# Patient Record
Sex: Male | Born: 1995 | Race: Black or African American | Hispanic: No | Marital: Single | Smoking: Never smoker
Health system: Southern US, Community
[De-identification: ages and names within clinical notes are randomized; demographics above are authoritative.]

## PROBLEM LIST (undated history)

## (undated) DIAGNOSIS — J45909 Unspecified asthma, uncomplicated: Secondary | ICD-10-CM

## (undated) DIAGNOSIS — F988 Other specified behavioral and emotional disorders with onset usually occurring in childhood and adolescence: Secondary | ICD-10-CM

## (undated) DIAGNOSIS — J302 Other seasonal allergic rhinitis: Secondary | ICD-10-CM

## (undated) HISTORY — PX: OTHER SURGICAL HISTORY: SHX169

## (undated) HISTORY — DX: Unspecified asthma, uncomplicated: J45.909

## (undated) HISTORY — DX: Other specified behavioral and emotional disorders with onset usually occurring in childhood and adolescence: F98.8

## (undated) HISTORY — DX: Other seasonal allergic rhinitis: J30.2

---
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
## 2016-11-14 ENCOUNTER — Ambulatory Visit (INDEPENDENT_AMBULATORY_CARE_PROVIDER_SITE_OTHER): Payer: Managed Care, Other (non HMO) | Admitting: Family Medicine

## 2016-11-14 ENCOUNTER — Encounter: Payer: Self-pay | Admitting: Family Medicine

## 2016-11-14 ENCOUNTER — Other Ambulatory Visit (HOSPITAL_COMMUNITY)
Admission: RE | Admit: 2016-11-14 | Discharge: 2016-11-14 | Disposition: A | Discharge status: Home / Self Care | Payer: Managed Care, Other (non HMO) | Source: Ambulatory Visit | Attending: Family Medicine | Admitting: Family Medicine

## 2016-11-14 VITALS — BP 122/88 | HR 62 | Temp 98.1°F | Ht 72.0 in | Wt 152.4 lb

## 2016-11-14 DIAGNOSIS — Z7251 High risk heterosexual behavior: Secondary | ICD-10-CM

## 2016-11-14 DIAGNOSIS — M545 Low back pain, unspecified: Secondary | ICD-10-CM

## 2016-11-14 DIAGNOSIS — A749 Chlamydial infection, unspecified: Secondary | ICD-10-CM | POA: Diagnosis not present

## 2016-11-14 DIAGNOSIS — J4520 Mild intermittent asthma, uncomplicated: Secondary | ICD-10-CM | POA: Diagnosis not present

## 2016-11-14 DIAGNOSIS — J302 Other seasonal allergic rhinitis: Secondary | ICD-10-CM | POA: Insufficient documentation

## 2016-11-14 DIAGNOSIS — N489 Disorder of penis, unspecified: Secondary | ICD-10-CM | POA: Diagnosis not present

## 2016-11-14 DIAGNOSIS — B9689 Other specified bacterial agents as the cause of diseases classified elsewhere: Secondary | ICD-10-CM | POA: Diagnosis not present

## 2016-11-14 DIAGNOSIS — R030 Elevated blood-pressure reading, without diagnosis of hypertension: Secondary | ICD-10-CM

## 2016-11-14 DIAGNOSIS — Z202 Contact with and (suspected) exposure to infections with a predominantly sexual mode of transmission: Secondary | ICD-10-CM | POA: Insufficient documentation

## 2016-11-14 DIAGNOSIS — R202 Paresthesia of skin: Secondary | ICD-10-CM | POA: Diagnosis not present

## 2016-11-14 DIAGNOSIS — J45909 Unspecified asthma, uncomplicated: Secondary | ICD-10-CM | POA: Insufficient documentation

## 2016-11-14 LAB — CBC
HEMATOCRIT: 44.4 % (ref 39.0–52.0)
HEMOGLOBIN: 14.8 g/dL (ref 13.0–17.0)
MCHC: 33.3 g/dL (ref 30.0–36.0)
MCV: 87.9 fl (ref 78.0–100.0)
PLATELETS: 223 10*3/uL (ref 150.0–400.0)
RBC: 5.05 Mil/uL (ref 4.22–5.81)
RDW: 12.7 % (ref 11.5–15.5)
WBC: 4 10*3/uL (ref 4.0–10.5)

## 2016-11-14 LAB — TSH: TSH: 0.64 u[IU]/mL (ref 0.35–4.50)

## 2016-11-14 LAB — COMPREHENSIVE METABOLIC PANEL
ALBUMIN: 5.1 g/dL (ref 3.5–5.2)
ALK PHOS: 69 U/L (ref 39–117)
ALT: 29 U/L (ref 0–53)
AST: 25 U/L (ref 0–37)
BILIRUBIN TOTAL: 2.1 mg/dL — AB (ref 0.2–1.2)
BUN: 10 mg/dL (ref 6–23)
CALCIUM: 10.2 mg/dL (ref 8.4–10.5)
CO2: 28 mEq/L (ref 19–32)
CREATININE: 1 mg/dL (ref 0.40–1.50)
Chloride: 101 mEq/L (ref 96–112)
GFR: 120.89 mL/min (ref >60.00)
Glucose, Bld: 76 mg/dL (ref 70–99)
Potassium: 3.9 mEq/L (ref 3.5–5.1)
Sodium: 138 mEq/L (ref 135–145)
TOTAL PROTEIN: 8.2 g/dL (ref 6.0–8.3)

## 2016-11-14 LAB — POC URINALSYSI DIPSTICK (AUTOMATED)
BILIRUBIN UA: NEGATIVE
Clarity, UA: NEGATIVE
Glucose, UA: NEGATIVE
Leukocytes, UA: NEGATIVE
PH UA: 6.5 (ref 5.0–8.0)
Spec Grav, UA: 1.02 (ref 1.010–1.025)
Urobilinogen, UA: 0.2 E.U./dL

## 2016-11-14 LAB — VITAMIN B12: VITAMIN B 12: 422 pg/mL (ref 211–911)

## 2016-11-14 MED ORDER — AZITHROMYCIN 500 MG PO TABS: ORAL_TABLET | ORAL | 0 refills | Status: DC

## 2016-11-14 NOTE — Assessment & Plan Note (Signed)
S: usually bothered by allergy season- but has been taking claritin which helps. Albuterol prn- rare use at present A/P: offered refill of albuterol which he declines for now

## 2016-11-14 NOTE — Assessment & Plan Note (Signed)
S: initial # elevated- better on repeat.  BP Readings from Last 3 Encounters:  11/14/16 130/90--> 122/88  11/15/11 132/72  A/P: We discussed blood pressure goal of <140/90 prefer <130/80. Discussed low salt diet

## 2016-11-14 NOTE — Patient Instructions (Addendum)
Blood pressure riding high normal. DASh eating plan/low salt diet can help  Please stop by lab before you go  Testing for hiv, syphilis, gonorrhea, chlamydia, trichomonas.  Visual inspection for warts and herpes Did not test herpes by blood. Should be immunized for Hep B so did not test that  No symptoms correct?   DASH Eating Plan DASH stands for "Dietary Approaches to Stop Hypertension." The DASH eating plan is a healthy eating plan that has been shown to reduce high blood pressure (hypertension). It may also reduce your risk for type 2 diabetes, heart disease, and stroke. The DASH eating plan may also help with weight loss. What are tips for following this plan? General guidelines  Avoid eating more than 2,300 mg (milligrams) of salt (sodium) a day. If you have hypertension, you may need to reduce your sodium intake to 1,500 mg a day.  Limit alcohol intake to no more than 1 drink a day for nonpregnant women and 2 drinks a day for men. One drink equals 12 oz of beer, 5 oz of wine, or 1 oz of hard liquor.  Work with your health care provider to maintain a healthy body weight or to lose weight. Ask what an ideal weight is for you.  Get at least 30 minutes of exercise that causes your heart to beat faster (aerobic exercise) most days of the week. Activities may include walking, swimming, or biking.  Work with your health care provider or diet and nutrition specialist (dietitian) to adjust your eating plan to your individual calorie needs. Reading food labels  Check food labels for the amount of sodium per serving. Choose foods with less than 5 percent of the Daily Value of sodium. Generally, foods with less than 300 mg of sodium per serving fit into this eating plan.  To find whole grains, look for the word "whole" as the first word in the ingredient list. Shopping  Buy products labeled as "low-sodium" or "no salt added."  Buy fresh foods. Avoid canned foods and premade or frozen  meals. Cooking  Avoid adding salt when cooking. Use salt-free seasonings or herbs instead of table salt or sea salt. Check with your health care provider or pharmacist before using salt substitutes.  Do not fry foods. Cook foods using healthy methods such as baking, boiling, grilling, and broiling instead.  Cook with heart-healthy oils, such as olive, canola, soybean, or sunflower oil. Meal planning   Eat a balanced diet that includes: ? 5 or more servings of fruits and vegetables each day. At each meal, try to fill half of your plate with fruits and vegetables. ? Up to 6-8 servings of whole grains each day. ? Less than 6 oz of lean meat, poultry, or fish each day. A 3-oz serving of meat is about the same size as a deck of cards. One egg equals 1 oz. ? 2 servings of low-fat dairy each day. ? A serving of nuts, seeds, or beans 5 times each week. ? Heart-healthy fats. Healthy fats called Omega-3 fatty acids are found in foods such as flaxseeds and coldwater fish, like sardines, salmon, and mackerel.  Limit how much you eat of the following: ? Canned or prepackaged foods. ? Food that is high in trans fat, such as fried foods. ? Food that is high in saturated fat, such as fatty meat. ? Sweets, desserts, sugary drinks, and other foods with added sugar. ? Full-fat dairy products.  Do not salt foods before eating.  Try to eat  at least 2 vegetarian meals each week.  Eat more home-cooked food and less restaurant, buffet, and fast food.  When eating at a restaurant, ask that your food be prepared with less salt or no salt, if possible. What foods are recommended? The items listed may not be a complete list. Talk with your dietitian about what dietary choices are best for you. Grains Whole-grain or whole-wheat bread. Whole-grain or whole-wheat pasta. Brown rice. [place]. Bulgur. Whole-grain and low-sodium cereals. Pita bread. Low-fat, low-sodium crackers. Whole-wheat flour  tortillas. Vegetables Fresh or frozen vegetables (raw, steamed, roasted, or grilled). Low-sodium or reduced-sodium tomato and vegetable juice. Low-sodium or reduced-sodium tomato sauce and tomato paste. Low-sodium or reduced-sodium canned vegetables. Fruits All fresh, dried, or frozen fruit. Canned fruit in natural juice (without added sugar). Meat and other protein foods Skinless chicken or [place]. Ground chicken or [place]. Pork with fat trimmed off. Fish and seafood. Egg whites. Dried beans, peas, or lentils. Unsalted nuts, nut butters, and seeds. Unsalted canned beans. Lean cuts of beef with fat trimmed off. Low-sodium, lean deli meat. Dairy Low-fat (1%) or fat-free (skim) milk. Fat-free, low-fat, or reduced-fat cheeses. Nonfat, low-sodium ricotta or cottage cheese. Low-fat or nonfat yogurt. Low-fat, low-sodium cheese. Fats and oils Soft margarine without trans fats. Vegetable oil. Low-fat, reduced-fat, or light mayonnaise and salad dressings (reduced-sodium). Canola, safflower, olive, soybean, and sunflower oils. Avocado. Seasoning and other foods Herbs. Spices. Seasoning mixes without salt. Unsalted popcorn and pretzels. Fat-free sweets. What foods are not recommended? The items listed may not be a complete list. Talk with your dietitian about what dietary choices are best for you. Grains Baked goods made with fat, such as croissants, muffins, or some breads. Dry pasta or rice meal packs. Vegetables Creamed or fried vegetables. Vegetables in a cheese sauce. Regular canned vegetables (not low-sodium or reduced-sodium). Regular canned tomato sauce and paste (not low-sodium or reduced-sodium). Regular tomato and vegetable juice (not low-sodium or reduced-sodium). Angie Fava. Olives. Fruits Canned fruit in a light or heavy syrup. Fried fruit. Fruit in cream or butter sauce. Meat and other protein foods Fatty cuts of meat. Ribs. Fried meat. Berniece Salines. Sausage. Bologna and other processed lunch meats.  Salami. Fatback. Hotdogs. Bratwurst. Salted nuts and seeds. Canned beans with added salt. Canned or smoked fish. Whole eggs or egg yolks. Chicken or [place] with skin. Dairy Whole or 2% milk, cream, and half-and-half. Whole or full-fat cream cheese. Whole-fat or sweetened yogurt. Full-fat cheese. Nondairy creamers. Whipped toppings. Processed cheese and cheese spreads. Fats and oils Butter. Stick margarine. Lard. Shortening. Ghee. Bacon fat. Tropical oils, such as coconut, palm kernel, or palm oil. Seasoning and other foods Salted popcorn and pretzels. Onion salt, garlic salt, seasoned salt, table salt, and sea salt. Worcestershire sauce. Tartar sauce. Barbecue sauce. Teriyaki sauce. Soy sauce, including reduced-sodium. Steak sauce. Canned and packaged gravies. Fish sauce. Oyster sauce. Cocktail sauce. Horseradish that you find on the shelf. Ketchup. Mustard. Meat flavorings and tenderizers. Bouillon cubes. Hot sauce and Tabasco sauce. Premade or packaged marinades. Premade or packaged taco seasonings. Relishes. Regular salad dressings. Where to find more information:  National Heart, Lung, and [place]: https://[place]-eaton.com/  American Heart Association: www.heart.org Summary  The DASH eating plan is a healthy eating plan that has been shown to reduce high blood pressure (hypertension). It may also reduce your risk for type 2 diabetes, heart disease, and stroke.  With the DASH eating plan, you should limit salt (sodium) intake to 2,300 mg a day. If you have hypertension,  you may need to reduce your sodium intake to 1,500 mg a day.  When on the DASH eating plan, aim to eat more fresh fruits and vegetables, whole grains, lean proteins, low-fat dairy, and heart-healthy fats.  Work with your health care provider or diet and nutrition specialist (dietitian) to adjust your eating plan to your individual calorie needs. This information is not intended to replace advice given to you by your health  care provider. Make sure you discuss any questions you have with your health care provider. Document Released: 03/28/2011 Document Revised: 04/01/2016 Document Reviewed: 04/01/2016 Elsevier Interactive Patient Education  2017 [employer]
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
## 2016-11-15 LAB — HIV ANTIBODY (ROUTINE TESTING W REFLEX): HIV: NONREACTIVE

## 2016-11-15 LAB — RPR

## 2016-11-15 LAB — HSV(HERPES SIMPLEX VRS) I + II AB-IGG
HSV 1 GLYCOPROTEIN G AB, IGG: 40.9 {index} — AB (ref <0.90)
HSV 2 Glycoprotein G Ab, IgG: <0.9 Index (ref <0.90)

## 2016-11-18 LAB — URINE CYTOLOGY ANCILLARY ONLY
CHLAMYDIA, DNA PROBE: POSITIVE — AB
NEISSERIA GONORRHEA: NEGATIVE
TRICH (WINDOWPATH): NEGATIVE

## 2016-11-19 LAB — HSV 1/2 AB (IGM), IFA W/RFLX TITER
HSV 1 IGM SCREEN: NEGATIVE
HSV 2 IgM Screen: NEGATIVE

## 2016-11-21 ENCOUNTER — Other Ambulatory Visit: Payer: Self-pay

## 2016-11-21 DIAGNOSIS — R17 Unspecified jaundice: Secondary | ICD-10-CM

## 2016-11-26 ENCOUNTER — Encounter: Payer: Self-pay | Admitting: Family Medicine

## 2016-12-17 NOTE — Addendum Note (Signed)
Addended by: Charna Elizabeth on: 12/17/2016 12:05 PM   Modules accepted: Orders

## 2016-12-20 ENCOUNTER — Other Ambulatory Visit: Payer: Managed Care, Other (non HMO)

## 2017-02-24 ENCOUNTER — Emergency Department (HOSPITAL_COMMUNITY)
Admission: EM | Admit: 2017-02-24 | Discharge: 2017-02-24 | Disposition: A | Discharge status: Home / Self Care | Payer: Managed Care, Other (non HMO) | Attending: Emergency Medicine | Admitting: Emergency Medicine

## 2017-02-24 ENCOUNTER — Ambulatory Visit (HOSPITAL_COMMUNITY)
Admission: EM | Admit: 2017-02-24 | Discharge: 2017-02-24 | Disposition: A | Discharge status: Home / Self Care | Payer: Managed Care, Other (non HMO)

## 2017-02-24 DIAGNOSIS — F0781 Postconcussional syndrome: Secondary | ICD-10-CM | POA: Diagnosis not present

## 2017-02-24 DIAGNOSIS — R51 Headache: Secondary | ICD-10-CM | POA: Diagnosis not present

## 2017-02-24 DIAGNOSIS — J45909 Unspecified asthma, uncomplicated: Secondary | ICD-10-CM | POA: Diagnosis not present
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
## 2017-02-24 NOTE — ED Notes (Signed)
Pt sent to the ER
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
## 2017-02-24 NOTE — ED Provider Notes (Signed)
[facility]ENC[facility] Provider Note   CSN: [id number] Arrival date & time: 02/24/17  1349     History   Chief Complaint Chief Complaint  Patient presents with  . Head Injury    HPI Patrick Richmond is a 21 y.o. male.  HPI  21 year old male with headache and fatigue.  Patient reports that he was drinking a few days ago and passed out fell backwards striking his head.  Thinks he had a brief loss of consciousness.  No neck pain.  No nausea or vomiting.  This feels like his attention is "off." Broke down crying one time w/o apparent reason.  Past Medical History:  Diagnosis Date  . ADD (attention deficit disorder)    Dr. Herbie SaxonHewett-diagnosed by [facility]. Prior on concerta 54mg . 12/2012 mentioned diagnosed 5 years ago but was not on meds for sometime.   . Asthma    usually bothered by allergy season  . Seasonal allergies    claritin prn. spring and fall    Patient Active Problem List   Diagnosis Date Noted  . Elevated blood pressure reading 11/14/2016  . Asthma   . Seasonal allergies     Past Surgical History:  Procedure Laterality Date  . none         Home Medications    Prior to Admission medications   Medication Sig Start Date End Date Taking? Authorizing Provider  albuterol (PROVENTIL HFA;VENTOLIN HFA) 108 (90 Base) MCG/ACT inhaler Inhale 2 puffs every 6 (six) hours as needed into the lungs for wheezing.   Yes [provider]  ibuprofen (ADVIL,MOTRIN) 200 MG tablet Take 400 mg every 6 (six) hours as needed by mouth for mild pain.   Yes [provider]  azithromycin (ZITHROMAX) 500 MG tablet Take 2 pills all at once Patient not taking: Reported on 02/24/2017 11/14/16   Shelva MajesticHunter, Halston Fairclough O, MD    Family History Family History  Problem Relation Age of Onset  . Hypertension Father   . Healthy Mother   . Healthy Sister   . Healthy Brother   . Suicidality Maternal Grandfather        led to death  . Other Paternal  Grandfather        unknown  . Healthy Brother   . Healthy Brother     Social History Social History   Tobacco Use  . Smoking status: Never Smoker  . Smokeless tobacco: Never Used  Substance Use Topics  . Alcohol use: Yes    Alcohol/week: 0.0 - 3.6 oz  . Drug use: No     Allergies   Patient has no known allergies.   Review of Systems Review of Systems   All systems reviewed and negative, other than as noted in HPI.   All systems reviewed and negative, other than as noted in HPI. Physical Exam Updated Vital Signs BP (!) 162/119 (BP Location: Left Arm)   Pulse 71   Temp 98.4 F (36.9 C) (Oral)   Resp 18   Ht 6' (1.829 m)   Wt 70.3 kg (155 lb)   SpO2 100%   BMI 21.02 kg/m   Physical Exam  Constitutional: He is oriented to person, place, and time. He appears well-developed and well-nourished. No distress.  HENT:  Head: Normocephalic and atraumatic.  Eyes: Conjunctivae are normal. Right eye exhibits no discharge. Left eye exhibits no discharge.  Neck: Neck supple.  Cardiovascular: Normal rate, regular rhythm and normal heart sounds. Exam reveals no gallop and no  friction rub.  No murmur heard. Pulmonary/Chest: Effort normal and breath sounds normal. No respiratory distress.  Abdominal: Soft. He exhibits no distension. There is no tenderness.  Musculoskeletal: He exhibits no edema or tenderness.  Neurological: He is alert and oriented to person, place, and time. No cranial nerve deficit. He exhibits normal muscle tone. Coordination normal.  Skin: Skin is warm and dry.  Psychiatric: He has a normal mood and affect. His behavior is normal. Thought content normal.  Nursing note and vitals reviewed.    ED Treatments / Results  Labs (all labs ordered are listed, but only abnormal results are displayed) Labs Reviewed - No data to display  EKG  EKG Interpretation None       Radiology No results found.  Procedures Procedures (including critical care  time)  Medications Ordered in ED Medications - No data to display   Initial Impression / Assessment and Plan / ED Course  I have reviewed the triage vital signs and the nursing notes.  Pertinent labs & imaging results that were available during my care of the patient were reviewed by me and considered in my medical decision making (see chart for details).     21yM with likely post-concussive syndrome. Emotional lability can be a symptom of this.   Final Clinical Impressions(s) / ED Diagnoses   Final diagnoses:  Post concussion syndrome    ED Discharge Orders    None       Raeford Razor, MD 03/04/17 1208

## 2017-02-24 NOTE — ED Notes (Signed)
Pt verbalized understanding discharge instructions and denies any further needs or questions at this time. VS stable, ambulatory and steady gait.   

## 2017-03-05 ENCOUNTER — Telehealth: Payer: Self-pay | Admitting: Family Medicine

## 2017-03-05 NOTE — Telephone Encounter (Signed)
Patient returning missed phone call. Informed patient that note below stated he was not seen by Dr. Durene CalHunter for his concussion. Patient stated he was. Informed patient that today was  Dr. Erasmo LeventhalHunter's half day and I would have to leave a note. Please call patient and advise.

## 2017-03-05 NOTE — Telephone Encounter (Signed)
Tried to call patient back. His voice mail box is full. Patient was evaluated in the ED. We have not seen him for a concussion. He is supposed to schedule a follow up with Dr. Durene CalHunter per the instructions on his discharge paperwork from the hospital.

## 2017-03-05 NOTE — Telephone Encounter (Signed)
Patient says Dr. Durene CalHunter is supposed to give him a note RE concussion. He was in ED and needs one for school and work. Please advise.

## 2017-03-11 NOTE — Telephone Encounter (Signed)
Called patient. No answer received. The mailbox was full so I was unable to leave a message

## 2017-06-03 ENCOUNTER — Other Ambulatory Visit: Payer: Managed Care, Other (non HMO)

## 2017-06-03 NOTE — Addendum Note (Signed)
Addended by: Charna ElizabethLEMMONS, Khushboo Chuck L on: 06/03/2017 03:36 PM   Modules accepted: Orders

## 2017-06-04 ENCOUNTER — Other Ambulatory Visit: Payer: Managed Care, Other (non HMO)

## 2017-06-05 ENCOUNTER — Other Ambulatory Visit (INDEPENDENT_AMBULATORY_CARE_PROVIDER_SITE_OTHER): Payer: Managed Care, Other (non HMO)

## 2017-06-05 DIAGNOSIS — R17 Unspecified jaundice: Secondary | ICD-10-CM

## 2017-06-05 LAB — HEPATIC FUNCTION PANEL
ALT: 51 U/L (ref 0–53)
AST: 85 U/L — AB (ref 0–37)
Albumin: 4.3 g/dL (ref 3.5–5.2)
Alkaline Phosphatase: 66 U/L (ref 39–117)
BILIRUBIN DIRECT: 0.3 mg/dL (ref 0.0–0.3)
BILIRUBIN TOTAL: 1.8 mg/dL — AB (ref 0.2–1.2)
TOTAL PROTEIN: 6.9 g/dL (ref 6.0–8.3)

## 2017-06-06 LAB — BILIRUBIN, FRACTIONATED(TOT/DIR/INDIR)
BILIRUBIN DIRECT: 0.4 mg/dL — AB (ref 0.0–0.2)
Indirect Bilirubin: 1.2 mg/dL (calc) (ref 0.2–1.2)
Total Bilirubin: 1.6 mg/dL — ABNORMAL HIGH (ref 0.2–1.2)

## 2017-06-09 ENCOUNTER — Ambulatory Visit (INDEPENDENT_AMBULATORY_CARE_PROVIDER_SITE_OTHER): Payer: Managed Care, Other (non HMO) | Admitting: Family Medicine

## 2017-06-09 ENCOUNTER — Encounter: Payer: Self-pay | Admitting: Family Medicine

## 2017-06-09 ENCOUNTER — Telehealth: Payer: Self-pay | Admitting: Family Medicine

## 2017-06-09 VITALS — BP 128/88 | HR 69 | Temp 98.0°F | Ht 72.0 in | Wt 147.4 lb

## 2017-06-09 DIAGNOSIS — R17 Unspecified jaundice: Secondary | ICD-10-CM | POA: Diagnosis not present

## 2017-06-09 DIAGNOSIS — R740 Nonspecific elevation of levels of transaminase and lactic acid dehydrogenase [LDH]: Secondary | ICD-10-CM | POA: Diagnosis not present

## 2017-06-09 DIAGNOSIS — R7401 Elevation of levels of liver transaminase levels: Secondary | ICD-10-CM

## 2017-06-09 NOTE — Progress Notes (Signed)
Subjective:  Patrick Richmond is a 22 y.o. year old very pleasant male patient who presents for/with See problem oriented charting ROS- no RUQ pain, no nausea, vomiting. No fever or chills.    Past Medical History-  Patient Active Problem List   Diagnosis Date Noted  . Asthma     Priority: Medium  . Elevated blood pressure reading 11/14/2016    Priority: Low  . Seasonal allergies     Priority: Low  . Elevated bilirubin 06/09/2017    Medications- reviewed and updated Current Outpatient Medications  Medication Sig Dispense Refill  . albuterol (PROVENTIL HFA;VENTOLIN HFA) 108 (90 Base) MCG/ACT inhaler Inhale 2 puffs every 6 (six) hours as needed into the lungs for wheezing.    Marland Kitchen. ibuprofen (ADVIL,MOTRIN) 200 MG tablet Take 400 mg every 6 (six) hours as needed by mouth for mild pain.     No current facility-administered medications for this visit.     Objective: BP 128/88 (BP Location: Left Arm, Patient Position: Sitting, Cuff Size: Normal)   Pulse 69   Temp 98 F (36.7 C) (Oral)   Ht 6' (1.829 m)   Wt 147 lb 6.4 oz (66.9 kg)   SpO2 99%   BMI 19.99 kg/m  Gen: NAD, resting comfortably  Assessment/Plan:  Elevated bilirubin S: Patient was seen 6 months ago and baseline labs revealed bilirubin of 2.1. We opted for 6 month repeat given isolated finding. We opted for fractionated bilirubin and LFTs at repeat. Direct bilirubin was high at 0.4 (0-0.2 mg/dl normal) while indirect bilirubin was normal.   Patient tells me he was drinking alcohol in college heavy a few months ago but stopped 3 months ago.  A/P: 22 year old male with no alcohol use in last 3 months presents for follow up elevated bilirubin and on repeat also has mildly elevated AST but still under 100.   We had a long discussion today- also had this conversation with mother by phone with patient. We discussed getting labs like LFTs, GGT, Lipase (possible pancreatitis through doubt- checking given prior alcohol use). We  also discussed ultrasound of liver/RUQ considering direct bilirubin elevation. Patient and mother only want to do this if he has an increase in his levels. We also discussed hepatitis C testing- he was immunized for Hep B and doubt Hep A exposure though could test full panel in future if needed.   We reviewed an up to date handout together about potential causes. This is conjugated on labs- so we discussed unconjugated causes such as gilberts unlikely. We discussed possible genetic conditions like rotor syndrome and dubin johnson syndrome- possible. Discussed imaging particulary to see if any obvious extrahepatic causes such as tumors. Doubt AIDS cholangiopathy- though could screen at future visit if needed. Doubt intrahepatic but GGT will give us more information on that.   In the end patient and mother wanted to do labs and then trend q3-6 months before pursuing ultrasound of liver/ruq (pursue only if worsening)  Lab/Order associations: Elevated bilirubin - Plan: Hepatic function panel, Lipase, Gamma GT  Elevated AST (SGOT) - Plan: Hepatic function panel, Lipase, Gamma GT  Time Stamp The duration of face-to-face time during this visit was greater than 15 minutes. Greater than 50% of this time was spent in counseling, explanation of diagnosis, planning of further management, and/or coordination of care including discussion of potential causes and options for workup of elevated bilirubin and AST.     Return precautions advised.  Tana ConchStephen Pearlene Teat, MD

## 2017-06-09 NOTE — Telephone Encounter (Signed)
Patient calling again, would like a call back before appt today

## 2017-06-09 NOTE — Telephone Encounter (Signed)
Please contact patient to advise. Patient's appointment is at 4pm today.  Copied from CRM [phone]. Topic: General - Other >> Jun 09, 2017  8:38 AM Oneal GroutSebastian, Jennifer S wrote: Reason for CRM: Patient would like to know levels of labs before he comes in today and what the plan of care will be

## 2017-06-09 NOTE — Patient Instructions (Signed)
Please stop by lab before you go  We discussed doing ultrasound - for now you opted for labs first and then monitor every 3-6 months- if worsening then we would get ultrasound.

## 2017-06-09 NOTE — Assessment & Plan Note (Addendum)
S: Patient was seen 6 months ago and baseline labs revealed bilirubin of 2.1. We opted for 6 month repeat given isolated finding. We opted for fractionated bilirubin and LFTs at repeat. Direct bilirubin was high at 0.4 (0-0.2 mg/dl normal) while indirect bilirubin was normal.   Patient tells me he was drinking alcohol in college heavy a few months ago but stopped 3 months ago.  A/P: 22 year old male with no alcohol use in last 3 months presents for follow up elevated bilirubin and on repeat also has mildly elevated AST but still under 100.   We had a long discussion today- also had this conversation with mother by phone with patient. We discussed getting labs like LFTs, GGT, Lipase (possible pancreatitis through doubt- checking given prior alcohol use). We also discussed ultrasound of liver/RUQ considering direct bilirubin elevation. Patient and mother only want to do this if he has an increase in his levels. We also discussed hepatitis C testing- he was immunized for Hep B and doubt Hep A exposure though could test full panel in future if needed.   We reviewed an up to date handout together about potential causes. This is conjugated on labs- so we discussed unconjugated causes such as gilberts unlikely. We discussed possible genetic conditions like rotor syndrome and dubin johnson syndrome- possible. Discussed imaging particulary to see if any obvious extrahepatic causes such as tumors. Doubt AIDS cholangiopathy- though could screen at future visit if needed. Doubt intrahepatic but GGT will give us more information on that.   In the end patient and mother wanted to do labs and then trend q3-6 months before pursuing ultrasound of liver/ruq (pursue only if worsening)

## 2017-06-09 NOTE — Telephone Encounter (Signed)
See note if anything further needs to be done.

## 2017-06-10 LAB — GAMMA GT: GGT: 40 U/L (ref 7–51)

## 2017-06-10 LAB — HEPATIC FUNCTION PANEL
ALK PHOS: 64 U/L (ref 39–117)
ALT: 38 U/L (ref 0–53)
AST: 27 U/L (ref 0–37)
Albumin: 4.4 g/dL (ref 3.5–5.2)
BILIRUBIN DIRECT: 0.3 mg/dL (ref 0.0–0.3)
BILIRUBIN TOTAL: 1.6 mg/dL — AB (ref 0.2–1.2)
TOTAL PROTEIN: 7 g/dL (ref 6.0–8.3)

## 2017-06-10 LAB — LIPASE: Lipase: 29 U/L (ref 11.0–59.0)

## 2017-06-10 NOTE — Telephone Encounter (Signed)
Spoke with patient yesterday and he was seen in the office

## 2017-06-11 ENCOUNTER — Other Ambulatory Visit: Payer: Self-pay | Admitting: Family Medicine

## 2017-06-11 DIAGNOSIS — R17 Unspecified jaundice: Secondary | ICD-10-CM

## 2018-01-15 NOTE — Progress Notes (Signed)
I called and spoke with the patient to get schedule for lab appointment. He stated he would call back to schedule

## 2018-02-12 ENCOUNTER — Telehealth: Payer: Self-pay

## 2018-02-12 NOTE — Telephone Encounter (Signed)
-----   Message from Shelva Majestic, MD sent at 12/15/2017  8:07 AM EDT ----- He needs to return for liver tests  ----- Message ----- From: SYSTEM Sent: 12/14/2017  12:07 AM EDT To: Shelva Majestic, MD

## 2018-02-12 NOTE — Telephone Encounter (Signed)
Called patient and was unable to leave a voicemail message as voicemail was not set up

## 2018-02-13 NOTE — Progress Notes (Signed)
LVM for patient to call back and schedule liver test

## 2018-02-17 ENCOUNTER — Telehealth: Payer: Self-pay

## 2018-02-17 NOTE — Telephone Encounter (Signed)
-----   Message from Stephen O Hunter, MD sent at 12/15/2017  8:07 AM EDT ----- He needs to return for liver tests  ----- Message ----- From: SYSTEM Sent: 12/14/2017  12:07 AM EDT To: Stephen O Hunter, MD   

## 2018-02-17 NOTE — Telephone Encounter (Signed)
Called and left a voicemail message asking patient to call and schedule an appointment to follow up on liver

## 2018-03-10 ENCOUNTER — Ambulatory Visit: Payer: Managed Care, Other (non HMO) | Admitting: Family Medicine

## 2018-03-10 DIAGNOSIS — Z0289 Encounter for other administrative examinations: Secondary | ICD-10-CM

## 2018-03-10 NOTE — Progress Notes (Deleted)
Subjective:  Patrick Richmond is a 22 y.o. year old very pleasant male patient who presents for/with See problem oriented charting ROS- ***   Past Medical History-  Patient Active Problem List   Diagnosis Date Noted  . Asthma     Priority: Medium  . Elevated blood pressure reading 11/14/2016    Priority: Low  . Seasonal allergies     Priority: Low  . Elevated bilirubin 06/09/2017    Medications- reviewed and updated Current Outpatient Medications  Medication Sig Dispense Refill  . albuterol (PROVENTIL HFA;VENTOLIN HFA) 108 (90 Base) MCG/ACT inhaler Inhale 2 puffs every 6 (six) hours as needed into the lungs for wheezing.    Marland Kitchen. ibuprofen (ADVIL,MOTRIN) 200 MG tablet Take 400 mg every 6 (six) hours as needed by mouth for mild pain.     Objective: There were no vitals taken for this visit. Gen: NAD, resting comfortably CV: RRR no murmurs rubs or gallops Lungs: CTAB no crackles, wheeze, rhonchi Abdomen: soft/nontender/nondistended/normal bowel sounds. No rebound or guarding.  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  AST and ALT have been under 100.  GGT and lipase were normal.  Patient remains alcohol free-had been drinking heavy alcohol when we first noted liver elevation  Have discussed-possible hepatitis C testing possible liver ultrasound.  He was immunized against hepatitis B and doubt hepatitis A exposure.  Doubt AIDS cholangiopathy.  Doubt intrahepatic since GGT not elevated.  Patient and family have only wanted to pursue ultrasound if liver function test worsen.  He denies right upper quadrant pain. A/P: Hopefully stable or improved.***  -Update CBC and CMP today No problem-specific Assessment & Plan notes found for this encounter.   Future Appointments  Date Time Provider Department Center   03/10/2018  8:30 AM Shelva MajesticHunter, Shatia Sindoni O, MD [facility]  05/14/2018  2:40 PM Durene CalHunter, Aldine ContesStephen O, MD [facility]   No follow-ups on file.  Lab/Order associations: No diagnosis found.  No orders of the defined types were placed in this encounter.   Return precautions advised.  Tana ConchStephen Maycie Luera, MD

## 2018-03-12 ENCOUNTER — Encounter: Payer: Self-pay | Admitting: Family Medicine

## 2018-05-14 ENCOUNTER — Encounter: Payer: Managed Care, Other (non HMO) | Admitting: Family Medicine

## 2018-06-09 ENCOUNTER — Ambulatory Visit (HOSPITAL_COMMUNITY)
Admission: EM | Admit: 2018-06-09 | Discharge: 2018-06-09 | Disposition: A | Discharge status: Home / Self Care | Payer: Managed Care, Other (non HMO) | Attending: Family Medicine | Admitting: Family Medicine

## 2018-06-09 ENCOUNTER — Encounter (HOSPITAL_COMMUNITY): Payer: Self-pay

## 2018-06-09 DIAGNOSIS — B349 Viral infection, unspecified: Secondary | ICD-10-CM | POA: Diagnosis not present

## 2018-06-09 DIAGNOSIS — R110 Nausea: Secondary | ICD-10-CM | POA: Diagnosis not present

## 2018-06-09 DIAGNOSIS — R197 Diarrhea, unspecified: Secondary | ICD-10-CM

## 2018-06-09 MED ORDER — IBUPROFEN 600 MG PO TABS: 600.0000 mg | ORAL_TABLET | Freq: Four times a day (QID) | ORAL | 0 refills | Status: DC | PRN

## 2018-06-09 MED ORDER — ONDANSETRON 4 MG PO TBDP: 4.0000 mg | ORAL_TABLET | Freq: Three times a day (TID) | ORAL | 0 refills | Status: DC | PRN

## 2018-06-09 NOTE — ED Provider Notes (Signed)
[facility]    CSN: [id number] Arrival date & time: 06/09/18  [id number]     History   Chief Complaint Chief Complaint  Patient presents with  . Generalized Body Aches  . Chills    HPI Patrick Richmond is a 23 y.o. male history of asthma presenting today for evaluation of body aches, chills and stomach upset.  Patient states that over the past 1 to 2 days he has had body aches, chills, nausea and diarrhea.  He denies any episodes of vomiting.  He has had decreased oral intake due to the symptoms.  Denies abdominal pain.  Denies associated URI symptoms of cough, congestion shortness of breath.  Denies sore throat.  He has not taken any medicines for symptoms.  Fevers have been subjective.  Denies blood in the stool.  HPI  Past Medical History:  Diagnosis Date  . ADD (attention deficit disorder)    Dr. Herbie SaxonHewett-diagnosed by [facility]. Prior on concerta 54mg . 12/2012 mentioned diagnosed 5 years ago but was not on meds for sometime.   . Asthma    usually bothered by allergy season  . Seasonal allergies    claritin prn. spring and fall    Patient Active Problem List   Diagnosis Date Noted  . Elevated bilirubin 06/09/2017  . Elevated blood pressure reading 11/14/2016  . Asthma   . Seasonal allergies     Past Surgical History:  Procedure Laterality Date  . none         Home Medications    Prior to Admission medications   Medication Sig Start Date End Date Taking? Authorizing Provider  albuterol (PROVENTIL HFA;VENTOLIN HFA) 108 (90 Base) MCG/ACT inhaler Inhale 2 puffs every 6 (six) hours as needed into the lungs for wheezing.    [provider]  ibuprofen (ADVIL,MOTRIN) 600 MG tablet Take 1 tablet (600 mg total) by mouth every 6 (six) hours as needed. 06/09/18   Feliciana Narayan C, PA-C  ondansetron (ZOFRAN ODT) 4 MG disintegrating tablet Take 1 tablet (4 mg total) by mouth every 8 (eight) hours as needed for nausea or vomiting. 06/09/18   Cartrell Bentsen, Junius CreamerHallie  C, PA-C    Family History Family History  Problem Relation Age of Onset  . Hypertension Father   . Healthy Mother   . Healthy Sister   . Healthy Brother   . Suicidality Maternal Grandfather        led to death  . Other Paternal Grandfather        unknown  . Healthy Brother   . Healthy Brother     Social History Social History   Tobacco Use  . Smoking status: Never Smoker  . Smokeless tobacco: Never Used  Substance Use Topics  . Alcohol use: Yes    Alcohol/week: 0.0 - 6.0 standard drinks  . Drug use: No     Allergies   Patient has no known allergies.   Review of Systems Review of Systems  Constitutional: Negative for activity change, appetite change, chills, fatigue and fever.  HENT: Negative for congestion, ear pain, rhinorrhea, sinus pressure, sore throat and trouble swallowing.   Eyes: Negative for discharge and redness.  Respiratory: Negative for cough, chest tightness and shortness of breath.   Cardiovascular: Negative for chest pain.  Gastrointestinal: Positive for diarrhea and nausea. Negative for abdominal pain and vomiting.  Musculoskeletal: Positive for myalgias.  Skin: Negative for rash.  Neurological: Positive for headaches. Negative for dizziness and light-headedness.     Physical Exam Triage  Vital Signs ED Triage Vitals  Enc Vitals Group     BP 06/09/18 0939 125/77     Pulse Rate 06/09/18 0939 90     Resp 06/09/18 0939 18     Temp 06/09/18 0939 98.8 F (37.1 C)     Temp Source 06/09/18 0939 Oral     SpO2 06/09/18 0939 100 %     Weight --      Height --      Head Circumference --      Peak Flow --      Pain Score 06/09/18 0941 5     Pain Loc --      Pain Edu? --      Excl. in GC? --    No data found.  Updated Vital Signs BP 125/77 (BP Location: Right Arm)   Pulse 90   Temp 98.8 F (37.1 C) (Oral)   Resp 18   SpO2 100%   Visual Acuity Right Eye Distance:   Left Eye Distance:   Bilateral Distance:    Right Eye Near:   Left  Eye Near:    Bilateral Near:     Physical Exam Vitals signs and nursing note reviewed.  Constitutional:      Appearance: He is well-developed.  HENT:     Head: Normocephalic and atraumatic.     Ears:     Comments: Bilateral ears without tenderness to palpation of external auricle, tragus and mastoid, EAC's without erythema or swelling, TM's with good bony landmarks and cone of light. Non erythematous.     Mouth/Throat:     Comments: Oral mucosa pink and moist, no tonsillar enlargement or exudate. Posterior pharynx patent and nonerythematous, no uvula deviation or swelling. Normal phonation.  Eyes:     Conjunctiva/sclera: Conjunctivae normal.  Neck:     Musculoskeletal: Neck supple.  Cardiovascular:     Rate and Rhythm: Normal rate and regular rhythm.     Heart sounds: No murmur.  Pulmonary:     Effort: Pulmonary effort is normal. No respiratory distress.     Breath sounds: Normal breath sounds.     Comments: Breathing comfortably at rest, CTABL, no wheezing, rales or other adventitious sounds auscultated Abdominal:     Palpations: Abdomen is soft.     Tenderness: There is no abdominal tenderness.     Comments: Nontender light deep palpation throughout abdomen  Skin:    General: Skin is warm and dry.  Neurological:     Mental Status: He is alert.      UC Treatments / Results  Labs (all labs ordered are listed, but only abnormal results are displayed) Labs Reviewed - No data to display  EKG None  Radiology No results found.  Procedures Procedures (including critical care time)  Medications Ordered in UC Medications - No data to display  Initial Impression / Assessment and Plan / UC Course  I have reviewed the triage vital signs and the nursing notes.  Pertinent labs & imaging results that were available during my care of the patient were reviewed by me and considered in my medical decision making (see chart for details).     Exam unremarkable, vital signs  stable, most likely viral etiology causing upset stomach, hot and cold chills and body aches.  Will recommend symptomatic and supportive care.  Zofran as needed for nausea, discussed importance of oral rehydration and pushing fluids in order to prevent dehydration.  Slowly transition diet.  Anti-inflammatories as needed for body aches, advised  to try to have food on stomach when taking these.  Continue to monitor,Discussed strict return precautions. Patient verbalized understanding and is agreeable with plan.  Final Clinical Impressions(s) / UC Diagnoses   Final diagnoses:  Nausea without vomiting  Diarrhea, unspecified type  Viral illness     Discharge Instructions     Your nausea, vomiting, and diarrhea appear to have a viral cause. Your symptoms should improve over the next week as your body continues to rid the infectious cause.  Ibuprofen and tylneol for body aches- try to have food on stomach while taking  For nausea: Zofran prescribed. Begin with every 6 hours, than as you are able to hold food down, take it as needed. Start with clear liquids, then move to plain foods like bananas, rice, applesauce, toast, broth, grits, oatmeal. As those food settle okay you may transition to your normal foods. Avoid spicy and greasy foods as much as possible.  For Diarrhea: This is your body's natural way of getting rid of a virus. You may try taking 1 imodium to decrease amount of stools a day, but we do not want you to stop your diarrhea.   Preventing dehydration is key! You need to replace the fluid your body is expelling. Drink plenty of fluids, may use Pedialyte or sports drinks.   Please return if you are experiencing blood in your vomit or stool or experiencing dizziness, lightheadedness, extreme fatigue, increased abdominal pain.    ED Prescriptions    Medication Sig Dispense Auth. Provider   ondansetron (ZOFRAN ODT) 4 MG disintegrating tablet Take 1 tablet (4 mg total) by mouth every 8  (eight) hours as needed for nausea or vomiting. 20 tablet Demorio Seeley C, PA-C   ibuprofen (ADVIL,MOTRIN) 600 MG tablet Take 1 tablet (600 mg total) by mouth every 6 (six) hours as needed. 30 tablet Elishua Radford, [place] C, PA-C     Controlled Substance Prescriptions [place] Controlled Substance Registry consulted? Not Applicable   Lew Dawes, [place] 06/09/18 1013

## 2018-06-09 NOTE — ED Triage Notes (Signed)
Pt presents with generalized body aches and chills.

## 2018-06-09 NOTE — Discharge Instructions (Addendum)
Your nausea, vomiting, and diarrhea appear to have a viral cause. Your symptoms should improve over the next week as your body continues to rid the infectious cause.  Ibuprofen and tylneol for body aches- try to have food on stomach while taking  For nausea: Zofran prescribed. Begin with every 6 hours, than as you are able to hold food down, take it as needed. Start with clear liquids, then move to plain foods like bananas, rice, applesauce, toast, broth, grits, oatmeal. As those food settle okay you may transition to your normal foods. Avoid spicy and greasy foods as much as possible.  For Diarrhea: This is your body's natural way of getting rid of a virus. You may try taking 1 imodium to decrease amount of stools a day, but we do not want you to stop your diarrhea.   Preventing dehydration is key! You need to replace the fluid your body is expelling. Drink plenty of fluids, may use Pedialyte or sports drinks.   Please return if you are experiencing blood in your vomit or stool or experiencing dizziness, lightheadedness, extreme fatigue, increased abdominal pain.

## 2018-07-06 ENCOUNTER — Other Ambulatory Visit: Payer: Self-pay

## 2018-07-06 ENCOUNTER — Encounter: Payer: Managed Care, Other (non HMO) | Admitting: Family Medicine

## 2018-07-06 ENCOUNTER — Ambulatory Visit (INDEPENDENT_AMBULATORY_CARE_PROVIDER_SITE_OTHER): Payer: Managed Care, Other (non HMO) | Admitting: Family Medicine

## 2018-07-06 ENCOUNTER — Encounter: Payer: Self-pay | Admitting: Family Medicine

## 2018-07-06 VITALS — BP 120/70 | HR 94 | Temp 99.5°F | Ht 70.0 in | Wt 147.0 lb

## 2018-07-06 DIAGNOSIS — Z113 Encounter for screening for infections with a predominantly sexual mode of transmission: Secondary | ICD-10-CM | POA: Diagnosis not present

## 2018-07-06 DIAGNOSIS — R17 Unspecified jaundice: Secondary | ICD-10-CM

## 2018-07-06 DIAGNOSIS — R059 Cough, unspecified: Secondary | ICD-10-CM

## 2018-07-06 DIAGNOSIS — Z0000 Encounter for general adult medical examination without abnormal findings: Secondary | ICD-10-CM | POA: Diagnosis not present

## 2018-07-06 DIAGNOSIS — Z118 Encounter for screening for other infectious and parasitic diseases: Secondary | ICD-10-CM

## 2018-07-06 DIAGNOSIS — Z13220 Encounter for screening for lipoid disorders: Secondary | ICD-10-CM

## 2018-07-06 DIAGNOSIS — Z114 Encounter for screening for human immunodeficiency virus [HIV]: Secondary | ICD-10-CM

## 2018-07-06 DIAGNOSIS — R05 Cough: Secondary | ICD-10-CM

## 2018-07-06 LAB — POCT INFLUENZA A/B
Influenza A, POC: NEGATIVE
Influenza B, POC: NEGATIVE

## 2018-07-06 NOTE — Progress Notes (Signed)
Phone: [phone]    Subjective:  Patient presents today for their annual physical. Chief complaint-noted.   See problem oriented charting- ROS- full  review of systems was completed and negative except for: elevated temperature, cough, nasal congestion.   The following were reviewed and entered/updated in epic: Past Medical History:  Diagnosis Date  . ADD (attention deficit disorder)    Dr. Madie Reno by [facility]. Prior on concerta 26RS. 12/2012 mentioned diagnosed 5 years ago but was not on meds for sometime.   . Asthma    usually bothered by allergy season  . Seasonal allergies    claritin prn. spring and fall   Patient Active Problem List   Diagnosis Date Noted  . Elevated bilirubin 06/09/2017    Priority: Medium  . Asthma     Priority: Medium  . Elevated blood pressure reading 11/14/2016    Priority: Low  . Seasonal allergies     Priority: Low   Past Surgical History:  Procedure Laterality Date  . none      Family History  Problem Relation Age of Onset  . Hypertension Father   . Healthy Mother   . Healthy Sister   . Healthy Brother   . Suicidality Maternal Grandfather        led to death  . Other Paternal Grandfather        unknown  . Healthy Brother   . Healthy Brother     Medications- reviewed and updated Current Outpatient Medications  Medication Sig Dispense Refill  . albuterol (PROVENTIL HFA;VENTOLIN HFA) 108 (90 Base) MCG/ACT inhaler Inhale 2 puffs every 6 (six) hours as needed into the lungs for wheezing.     No current facility-administered medications for this visit.     Allergies-reviewed and updated No Known Allergies  Social History   Social History Narrative   Single but dating. Lives with father- plans to move into apartment by himself.       [employer]- will finish in 2021. 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  Pharynx and nasal turbinates with mild erythema and clear discharge Neck: no thyromegaly or cervical lymphadenopathy CV: RRR no murmurs rubs or gallops Lungs: CTAB no crackles, wheeze, rhonchi Abdomen: soft/nontender/nondistended/normal bowel sounds. No rebound or guarding.  Ext: no edema Skin: warm, dry Neuro: grossly normal, moves all extremities, PERRLA     Assessment and Plan:  23 y.o. male presenting for annual physical.  Health Maintenance counseling: 1. Anticipatory guidance: Patient counseled regarding regular dental exams q6 months, eye exams - yearly,  avoiding smoking and second hand smoke , limiting alcohol to 2 beverages per day (doesn't drink anymore).   2. Risk factor reduction:  Advised patient of need for regular exercise and diet rich and fruits and vegetables to reduce risk of heart attack and stroke. Exercise- 3x a week  doing calisthenic. Diet-trying to cook more and eating healthier meals.  Wt Readings from Last 3 Encounters:  07/06/18 147 lb (66.7 kg)  06/09/17 147 lb 6.4 oz (66.9 kg)  02/24/17 155 lb (70.3 kg)  3. Immunizations/screenings/ancillary studies-declines flu shot for season- with current illness would not recommend any way  Immunization History  Administered Date(s) Administered  . MMR 09/20/1996, 11/21/2000  . Tdap 07/24/2016  4. Prostate cancer screening-   No family history, start at age 83   5. Colon cancer screening -  No family history, start at age 30   6. Skin cancer screening/prevention-low  risk is African-American.  Advised regular sunscreen use. Denies worrisome, changing, or new skin lesions.  7. Testicular cancer screening- advised monthly self exams  8. STD screening- patient opts out- only having sex with girlfriend - has not been tested since being active with her. Had one episode of unprotected sex.  9. Never smoker  Status of chronic or acute concerns    Not having to use albuterol much- sometimes in allergy season. Has inhaler available and doesn't need refills.   Seasonal allergies- hasnt started claritin yet but typically does in spring  blodo pressure elevations in past- looks great today  Elevated bilirbuin- has had reassuring workup in past. Has had elevated AST but other labs largely reassuring like lipase, GGT. Will update with labs  Cough, congestion, low grade temperature elevation into 99s. Sick since Friday- he didn't even think it was that bad. No shortness of breath. Some chest congestion and wheeze. Has not had to use albuterol  Black spots in front of eye perhaps once a month. Lasts for a few minutes then goes away. Can happen with increased pressure like bending over but not always. Had eyes checked recently and was told no issues- they were aware of black dots.  Return in about 1 year (around 07/06/2019) for physical.  Lab/Order associations: will come back for fasting labs  Preventative health care - Plan: CBC, Comprehensive metabolic panel, Lipid panel, RPR, HIV Antibody (routine testing w rflx), Urine cytology ancillary only  Cough - Plan: POC Influenza A/B  Screening for HIV (human immunodeficiency virus) - Plan: HIV Antibody (routine testing w rflx)  Screening examination for venereal disease - Plan: RPR  Screening for gonorrhea - Plan: Urine cytology ancillary only  Screening for chlamydial disease - Plan: Urine cytology ancillary only  Elevated bilirubin - Plan: CBC, Comprehensive metabolic panel  Screening for hyperlipidemia - Plan:  Lipid panel  Return precautions advised.   Garret Reddish, MD

## 2018-07-06 NOTE — Patient Instructions (Addendum)
Schedule a lab visit for approximately 2 weeks (make sure fever and cough are gone before coming back). Return for future fasting labs meaning nothing but water after midnight please. Ok to take your medications with water.   Flu test is negative. Likely have a mild viral infection (upper respiratory infection). We do not have enough supplies to test for coronavirus/covid 19 but staying at home for next 7 days would be advisable. If you have worsening fever or shortness of breath please let us know.

## 2018-07-23 ENCOUNTER — Telehealth: Payer: Self-pay | Admitting: Family Medicine

## 2018-07-23 NOTE — Telephone Encounter (Signed)
Dr. Durene Cal, OK to extend work note?

## 2018-07-23 NOTE — Telephone Encounter (Signed)
Advise webex or doxy.me call  that so we can discuss the situation and current symptoms

## 2018-07-23 NOTE — Telephone Encounter (Signed)
Copied from CRM [id number]. Topic: General - Other >> Jul 23, 2018 12:25 PM Marylen Ponto wrote: Reason for CRM: Pt called to request that his doctor note be extended until Monday 07/27/18. Pt requests call back. Cb# [phone]

## 2018-07-24 ENCOUNTER — Encounter: Payer: Self-pay | Admitting: Family Medicine

## 2018-07-24 ENCOUNTER — Ambulatory Visit (INDEPENDENT_AMBULATORY_CARE_PROVIDER_SITE_OTHER): Payer: Managed Care, Other (non HMO) | Admitting: Family Medicine

## 2018-07-24 ENCOUNTER — Telehealth: Payer: Self-pay | Admitting: Family Medicine

## 2018-07-24 VITALS — Ht 70.0 in | Wt 147.0 lb

## 2018-07-24 DIAGNOSIS — J329 Chronic sinusitis, unspecified: Secondary | ICD-10-CM

## 2018-07-24 NOTE — Telephone Encounter (Signed)
Visit scheduled for today at 10:00 AM.

## 2018-07-24 NOTE — Progress Notes (Signed)
LOS- no charge  Patient had full resolution of symptoms on 07/19/2018 including cough. Apparently he had antibiotics sent in through on call physician weekend after his physical- for potential sinus infection. I do not recall getting a letter/fax for this from our on call nurses unfortunately.   3 days past resolution of symptoms to remain out of work- earliest he can Return to work April 2nd. New letter will be written.  He is going to sign up for mychart and when this is done I will forward letter to him through there.

## 2018-07-24 NOTE — Patient Instructions (Addendum)
No charge. 

## 2018-07-24 NOTE — Progress Notes (Signed)
Pt can view letter on Mychart

## 2018-07-24 NOTE — Telephone Encounter (Signed)
See visit from today- LOS no charge

## 2019-06-01 ENCOUNTER — Other Ambulatory Visit: Payer: Managed Care, Other (non HMO)

## 2019-08-19 ENCOUNTER — Encounter: Payer: Self-pay | Admitting: Family Medicine

## 2019-08-20 ENCOUNTER — Telehealth (INDEPENDENT_AMBULATORY_CARE_PROVIDER_SITE_OTHER): Payer: Managed Care, Other (non HMO) | Admitting: Family Medicine

## 2019-08-20 ENCOUNTER — Other Ambulatory Visit: Payer: Self-pay

## 2019-08-20 ENCOUNTER — Encounter: Payer: Self-pay | Admitting: Family Medicine

## 2019-08-20 VITALS — Ht 70.0 in | Wt 147.0 lb

## 2019-08-20 DIAGNOSIS — F988 Other specified behavioral and emotional disorders with onset usually occurring in childhood and adolescence: Secondary | ICD-10-CM | POA: Diagnosis not present

## 2019-08-20 MED ORDER — BUPROPION HCL ER (SR) 100 MG PO TB12: 100.0000 mg | ORAL_TABLET | Freq: Every day | ORAL | 5 refills | Status: AC

## 2019-08-20 NOTE — Telephone Encounter (Signed)
Called and scheduled appt for pt to be seen today at 1pm

## 2019-08-20 NOTE — Patient Instructions (Addendum)
24 year old male with history of ADD diagnosed by his pediatrician.  He has had worsening symptoms lately but he is also concerned about the potential side effects of stimulants-he does not like the jittery feeling and appetite suppression.  We talked about options of Wellbutrin and Strattera.  I think Wellbutrin sustained-release 100 mg would be a good starting point.  He knows to contact me in 2 to 4 weeks to give me an update through MyChart.  If this is working well we will continue it.  If it is not working well but he does not have side effects we can increase the dose.  If we reach a high dose of the Wellbutrin sustained release without benefit then we could also consider alternate like Strattera.  Recommended follow up: Return in about 1 month (around 09/19/2019) for mychart update.

## 2019-08-20 NOTE — Progress Notes (Signed)
Phone [phone] Virtual visit via Video note   Subjective:  Chief complaint: Chief Complaint  Patient presents with  . ADHD   This visit type was conducted due to national recommendations for restrictions regarding the COVID-19 Pandemic (e.g. social distancing).  This format is felt to be most appropriate for this patient at this time balancing risks to patient and risks to population by having him in for in person visit.  No physical exam was performed (except for noted visual exam or audio findings with Telehealth visits).    Our team/I connected with Davaris [place]wood at  1:00 PM EDT by a video enabled telemedicine application (doxy.me or caregility through epic) and verified that I am speaking with the correct person using two identifiers.  Location patient: [place] Location provider: [facility], office Persons participating in the virtual visit:  patient  Our team/I discussed the limitations of evaluation and management by telemedicine and the availability of in person appointments. In light of current covid-19 pandemic, patient also understands that we are trying to protect them by minimizing in office contact if at all possible.  The patient expressed consent for telemedicine visit and agreed to proceed. Patient understands insurance will be billed.   Past Medical History-  Patient Active Problem List   Diagnosis Date Noted  . Attention deficit disorder (ADD) in adult     Priority: High  . Elevated bilirubin 06/09/2017    Priority: Medium  . Asthma     Priority: Medium  . Elevated blood pressure reading 11/14/2016    Priority: Low  . Seasonal allergies     Priority: Low    Medications- reviewed and updated Current Outpatient Medications  Medication Sig Dispense Refill  . albuterol (PROVENTIL HFA;VENTOLIN HFA) 108 (90 Base) MCG/ACT inhaler Inhale 2 puffs every 6 (six) hours as needed into the lungs for wheezing.    Marland Kitchen buPROPion (WELLBUTRIN SR) 100 MG 12 hr tablet  Take 1 tablet (100 mg total) by mouth daily. 30 tablet 5   No current facility-administered medications for this visit.     Objective:  Ht 5\' 10"  (1.778 m)   Wt 147 lb (66.7 kg)   BMI 21.09 kg/m  self reported vitals Gen: NAD, resting comfortably Lungs: nonlabored, normal respiratory rate  Skin: appears dry, no obvious rash    Assessment and Plan   # social update-graduating from [employer] spring 2021. 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 He was on adderall and concerta in the past. He stopped medications when he was a 10 in high school. Felt like he had a handle on symptoms. At the time he was involved in sports and it reduced his appetite too much. He has started having more issues with focus in the last two years. Has increased more recently.   Concerta and adderall (worse than concerta)  made him feel jittery A/P: 24 year old male with history of ADD diagnosed by his pediatrician.  He has had worsening symptoms lately but he is also concerned about the potential side effects of stimulants-he does not like the jittery feeling and appetite suppression so he wants to avoid these.  We talked about options of Wellbutrin and Strattera.  I think Wellbutrin sustained-release 100 mg would be a good starting point.  He knows to contact me in 2 to 4 weeks to give me an update through MyChart.  If this is working well we will continue  it.  If it is not working well but he does not have side effects we can increase the dose.  If we reach a high dose of the Wellbutrin sustained release without benefit then we could also consider alternate like Strattera.  Recommended follow up: 1 month monitor check in.  We did not specifically discuss follow-up but would want to at least see him once yearly  Lab/Order associations:   ICD-10-CM   1. Attention deficit disorder (ADD) in adult  F98.8     Meds  ordered this encounter  Medications  . buPROPion (WELLBUTRIN SR) 100 MG 12 hr tablet    Sig: Take 1 tablet (100 mg total) by mouth daily.    Dispense:  30 tablet    Refill:  5    Return precautions advised.  Garret Reddish, MD

## 2022-01-14 ENCOUNTER — Encounter: Payer: Self-pay | Admitting: *Deleted

## 2022-04-04 ENCOUNTER — Encounter: Payer: Self-pay | Admitting: *Deleted

## 2022-06-27 ENCOUNTER — Telehealth: Payer: Self-pay | Admitting: Family Medicine

## 2022-06-27 NOTE — Telephone Encounter (Signed)
Pt states insurance told him that in order to have his Speech therapy covered. States he referral will need to go to [facility] in [place] since he is established there. Their fax number is [phone].

## 2022-06-27 NOTE — Telephone Encounter (Signed)
Pt has not been seen since 2021 will need visit for referral.

## 2022-06-28 NOTE — Telephone Encounter (Signed)
Called pt to inform of message below, but VM full. I sent a Mychart message explaining message below as well as reminding of [facility] policy that if patient is not seen within 3 years, they have to re-establish as new patient if appointment needed.
